# Patient Record
Sex: Female | Born: 1960 | Hispanic: No | Marital: Married | Smoking: Former smoker
Health system: Southern US, Community
[De-identification: ages and names within clinical notes are randomized; demographics above are authoritative.]

## PROBLEM LIST (undated history)

## (undated) DIAGNOSIS — C801 Malignant (primary) neoplasm, unspecified: Secondary | ICD-10-CM

## (undated) HISTORY — PX: ABDOMINAL HYSTERECTOMY: SHX81

---

## 1993-08-23 DIAGNOSIS — C801 Malignant (primary) neoplasm, unspecified: Secondary | ICD-10-CM

## 1993-08-23 HISTORY — DX: Malignant (primary) neoplasm, unspecified: C80.1

## 2016-03-17 ENCOUNTER — Ambulatory Visit: Payer: Self-pay | Attending: Internal Medicine

## 2016-10-04 ENCOUNTER — Other Ambulatory Visit: Payer: Self-pay | Admitting: Family Medicine

## 2016-10-04 DIAGNOSIS — Z1231 Encounter for screening mammogram for malignant neoplasm of breast: Secondary | ICD-10-CM

## 2016-10-05 ENCOUNTER — Encounter: Payer: Self-pay | Admitting: Radiology

## 2016-10-05 ENCOUNTER — Encounter (INDEPENDENT_AMBULATORY_CARE_PROVIDER_SITE_OTHER): Payer: Self-pay

## 2016-10-05 ENCOUNTER — Ambulatory Visit
Admission: RE | Admit: 2016-10-05 | Discharge: 2016-10-05 | Disposition: A | Discharge status: Home / Self Care | Payer: Managed Care, Other (non HMO) | Source: Ambulatory Visit | Attending: Family Medicine | Admitting: Family Medicine

## 2016-10-05 DIAGNOSIS — Z1231 Encounter for screening mammogram for malignant neoplasm of breast: Secondary | ICD-10-CM | POA: Insufficient documentation

## 2016-10-05 HISTORY — DX: Malignant (primary) neoplasm, unspecified: C80.1

## 2016-10-11 ENCOUNTER — Ambulatory Visit
Admission: RE | Admit: 2016-10-11 | Discharge: 2016-10-11 | Disposition: A | Discharge status: Home / Self Care | Payer: Managed Care, Other (non HMO) | Source: Ambulatory Visit | Attending: Family Medicine | Admitting: Family Medicine

## 2016-10-11 ENCOUNTER — Other Ambulatory Visit: Payer: Self-pay | Admitting: Family Medicine

## 2016-10-11 DIAGNOSIS — N6320 Unspecified lump in the left breast, unspecified quadrant: Secondary | ICD-10-CM

## 2016-10-11 DIAGNOSIS — R928 Other abnormal and inconclusive findings on diagnostic imaging of breast: Secondary | ICD-10-CM

## 2016-10-20 ENCOUNTER — Ambulatory Visit: Payer: Managed Care, Other (non HMO)

## 2016-10-20 ENCOUNTER — Other Ambulatory Visit: Payer: Managed Care, Other (non HMO)

## 2016-10-30 ENCOUNTER — Encounter: Payer: Self-pay | Admitting: *Deleted

## 2016-10-30 ENCOUNTER — Emergency Department: Payer: Managed Care, Other (non HMO)

## 2016-10-30 ENCOUNTER — Emergency Department
Admission: EM | Admit: 2016-10-30 | Discharge: 2016-10-30 | Disposition: A | Discharge status: Home / Self Care | Payer: Managed Care, Other (non HMO) | Attending: Emergency Medicine | Admitting: Emergency Medicine

## 2016-10-30 DIAGNOSIS — Z87891 Personal history of nicotine dependence: Secondary | ICD-10-CM | POA: Insufficient documentation

## 2016-10-30 DIAGNOSIS — R05 Cough: Secondary | ICD-10-CM | POA: Diagnosis present

## 2016-10-30 DIAGNOSIS — J209 Acute bronchitis, unspecified: Secondary | ICD-10-CM | POA: Diagnosis not present

## 2016-10-30 LAB — BASIC METABOLIC PANEL
ANION GAP: 8 (ref 5–15)
BUN: 10 mg/dL (ref 6–20)
CALCIUM: 9.4 mg/dL (ref 8.9–10.3)
CO2: 30 mmol/L (ref 22–32)
CREATININE: 0.71 mg/dL (ref 0.44–1.00)
Chloride: 102 mmol/L (ref 101–111)
Glucose, Bld: 120 mg/dL — ABNORMAL HIGH (ref 65–99)
Potassium: 4 mmol/L (ref 3.5–5.1)
Sodium: 140 mmol/L (ref 135–145)

## 2016-10-30 LAB — CBC
HCT: 43.3 % (ref 35.0–47.0)
HEMOGLOBIN: 15 g/dL (ref 12.0–16.0)
MCH: 30.1 pg (ref 26.0–34.0)
MCHC: 34.5 g/dL (ref 32.0–36.0)
MCV: 87.1 fL (ref 80.0–100.0)
PLATELETS: 311 10*3/uL (ref 150–440)
RBC: 4.98 MIL/uL (ref 3.80–5.20)
RDW: 12.8 % (ref 11.5–14.5)
WBC: 11.9 10*3/uL — ABNORMAL HIGH (ref 3.6–11.0)

## 2016-10-30 LAB — INFLUENZA PANEL BY PCR (TYPE A & B)
INFLAPCR: NEGATIVE
Influenza B By PCR: NEGATIVE

## 2016-10-30 LAB — TROPONIN I

## 2016-10-30 MED ORDER — PREDNISONE 20 MG PO TABS
60.0000 mg | ORAL_TABLET | Freq: Once | ORAL | Status: AC
Start: 2016-10-30 — End: 2016-10-30
  Administered 2016-10-30: 60 mg via ORAL
  Filled 2016-10-30: qty 3

## 2016-10-30 MED ORDER — PREDNISONE 20 MG PO TABS: 40.0000 mg | ORAL_TABLET | Freq: Every day | ORAL | 0 refills | Status: AC

## 2016-10-30 MED ORDER — ALBUTEROL SULFATE HFA 108 (90 BASE) MCG/ACT IN AERS: 2.0000 | INHALATION_SPRAY | Freq: Four times a day (QID) | RESPIRATORY_TRACT | 2 refills | Status: AC | PRN

## 2016-10-30 MED ORDER — IPRATROPIUM-ALBUTEROL 0.5-2.5 (3) MG/3ML IN SOLN
3.0000 mL | Freq: Once | RESPIRATORY_TRACT | Status: AC
  Administered 2016-10-30: 3 mL via RESPIRATORY_TRACT
  Filled 2016-10-30: qty 3

## 2016-10-30 MED ORDER — ACETAMINOPHEN 325 MG PO TABS
650.0000 mg | ORAL_TABLET | Freq: Once | ORAL | Status: AC | PRN
  Administered 2016-10-30: 650 mg via ORAL
  Filled 2016-10-30: qty 2

## 2016-10-30 MED ORDER — AZITHROMYCIN 250 MG PO TABS: ORAL_TABLET | ORAL | 0 refills | Status: AC

## 2016-10-30 NOTE — ED Triage Notes (Signed)
PT presents to ED [facility] due to low oxygen saturation of 91% on RA. No oxygen use at home. Pt denies having had fevers at home but reports a cough for the past two days with a sore throat and congestion. Pt reports "gold" colored sputum and also reports SOB.

## 2016-10-30 NOTE — Discharge Instructions (Signed)
Please return immediately if condition worsens. Please contact her primary physician or the physician you were given for referral. If you have any specialist physicians involved in her treatment and plan please also contact them. Thank you for using [facility]. ° °

## 2016-10-30 NOTE — ED Provider Notes (Signed)
Time Seen: Approximately 1830  I have reviewed the triage notes  Chief Complaint: Cough   History of Present Illness: Erica Hopkins is a 56 y.o. female who presents with a 2 day history of increasing dry nonproductive cough. States her symptoms started with some fever and body aches along with a sore throat and nasal congestion. She does have a productive nature to her cough. Patient has a long history of tobacco smoking but is not on any home oxygen therapy at this time. Patient denies any nausea, vomiting, loose stool or diarrhea, chest pain or abdominal pain. She denies any pulmonary emboli risk factors outside of smoking.. Remote history of Cervical cancer [facility] and was noted to have a pulse oximetry there of 92% on room air  Past Medical History:  Diagnosis Date  . Cancer ([place]) 1995   cervical ca    There are no active problems to display for this patient.   Past Surgical History:  Procedure Laterality Date  . ABDOMINAL HYSTERECTOMY      Past Surgical History:  Procedure Laterality Date  . ABDOMINAL HYSTERECTOMY        Allergies:  Patient has no allergy information on record.  Family History: Family History  Problem Relation Age of Onset  . Breast cancer Mother 37  . Breast cancer Maternal Aunt 72    Social History: Social History  Substance Use Topics  . Smoking status: Former Research scientist (life sciences)  . Smokeless tobacco: Never Used  . Alcohol use No     Review of Systems:   10 point review of systems was performed and was otherwise negative:  Constitutional: No fever Eyes: No visual disturbances ENT: No sore throat, ear pain Cardiac: No chest pain Respiratory: Shortness of breath especially with lying in a supine position. No audible wheezing or stridor at home Abdomen: No abdominal pain, no vomiting, No diarrhea Endocrine: No weight loss, No night sweats Extremities: No peripheral edema, cyanosis Skin: No  rashes, easy bruising Neurologic: No focal weakness, trouble with speech or swollowing Urologic: No dysuria, Hematuria, or urinary frequency   Physical Exam:  ED Triage Vitals  Enc Vitals Group     BP 10/30/16 1629 (!) 166/80     Pulse Rate 10/30/16 1629 96     Resp 10/30/16 1629 16     Temp 10/30/16 1629 (!) 100.9 F (38.3 C)     Temp Source 10/30/16 1629 Oral     SpO2 10/30/16 1629 94 %     Weight 10/30/16 1630 138 lb (62.6 kg)     Height 10/30/16 1630 5\' 5"  (1.651 m)     Head Circumference --      Peak Flow --      Pain Score --      Pain Loc --      Pain Edu? --      Excl. in [place]? --     General: Awake , Alert , and Oriented times 3; GCS 15 Head: Normal cephalic , atraumatic Eyes: Pupils equal , round, reactive to light Nose/Throat: No nasal drainage, patent upper airway without erythema or exudate.  Neck: Supple, Full range of motion, No anterior adenopathy or palpable thyroid masses Lungs: Mild end expiratory wheezing auscultated primarily at the apices Heart: Regular rate, regular rhythm without murmurs , gallops , or rubs Abdomen: Soft, non tender without rebound, guarding , or rigidity; bowel sounds positive and symmetric in all 4 quadrants. No organomegaly .  Extremities: 2 plus symmetric pulses. No edema, clubbing or cyanosis Neurologic: normal ambulation, Motor symmetric without deficits, sensory intact Skin: warm, dry, no rashes   Labs:   All laboratory work was reviewed including any pertinent negatives or positives listed below:  Labs Reviewed  BASIC METABOLIC PANEL - Abnormal; Notable for the following:       Result Value   Glucose, Bld 120 (*)    All other components within normal limits  CBC - Abnormal; Notable for the following:    WBC 11.9 (*)    All other components within normal limits  TROPONIN I  INFLUENZA PANEL BY PCR (TYPE A & B)    EKG: * ED ECG REPORT I, Daymon Larsen, the attending physician, personally viewed and interpreted  this ECG.  Date: 10/30/2016 EKG Time: *1639Rate: 93 Rhythm: normal sinus rhythm QRS Axis: normal Intervals: normal ST/T Wave abnormalities: normal Conduction Disturbances: none Narrative Interpretation: unremarkable No acute ischemic changes  Radiology: *  Chest x-ray shows no signs of infiltrate, pneumothorax. 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 She has a low-grade fever with a productive cough and be treated on an outpatient basis for bacterial bronchitis. Her pulse ox is somewhat labile but is generally around 94% she states she feels improved and will be discharged on an inhaler. I felt this was unlikely to be pulmonary embolism due to low risk factors, no calf tenderness or swelling, etc.     Assessment:  Acute bacterial bronchitis with bronchospasm Tobacco abuse     Plan: * Outpatient Patient was advised to return immediately if condition worsens. Patient was advised to follow up with their primary care physician or other specialized physicians involved in their outpatient care. The patient and/or family member/power of attorney had laboratory results reviewed at the bedside. All questions and concerns were addressed and appropriate discharge instructions were distributed by the nursing staff.             Daymon Larsen, MD 10/30/16 2029

## 2016-10-30 NOTE — ED Notes (Signed)
States sore throat, sinus congestions and itchy ears that began early this week, states cough that began Friday, states [facility] and was sent to Ed for eval, states her o2 levels were low at 92%, states hx of smoking, awake and alert

## 2017-09-21 ENCOUNTER — Other Ambulatory Visit: Payer: Self-pay | Admitting: Family Medicine

## 2017-09-21 DIAGNOSIS — Z1231 Encounter for screening mammogram for malignant neoplasm of breast: Secondary | ICD-10-CM

## 2017-10-11 ENCOUNTER — Ambulatory Visit
Admission: RE | Admit: 2017-10-11 | Discharge: 2017-10-11 | Disposition: A | Discharge status: Home / Self Care | Payer: Managed Care, Other (non HMO) | Source: Ambulatory Visit | Attending: Family Medicine | Admitting: Family Medicine

## 2017-10-11 DIAGNOSIS — Z1231 Encounter for screening mammogram for malignant neoplasm of breast: Secondary | ICD-10-CM | POA: Diagnosis present

## 2017-12-01 IMAGING — MG MM DIGITAL DIAGNOSTIC UNILAT*L* W/ TOMO W/ CAD
6 series · 6 of 14 positions shown · non-contrast
Comparison: Previous exam(s).

CLINICAL DATA: Callback from screening mammogram for possible mass
left breast

EXAM:
2D DIGITAL DIAGNOSTIC LEFT MAMMOGRAM WITH CAD AND ADJUNCT TOMO
ULTRASOUND LEFT BREAST

[L MLO synth-2D]
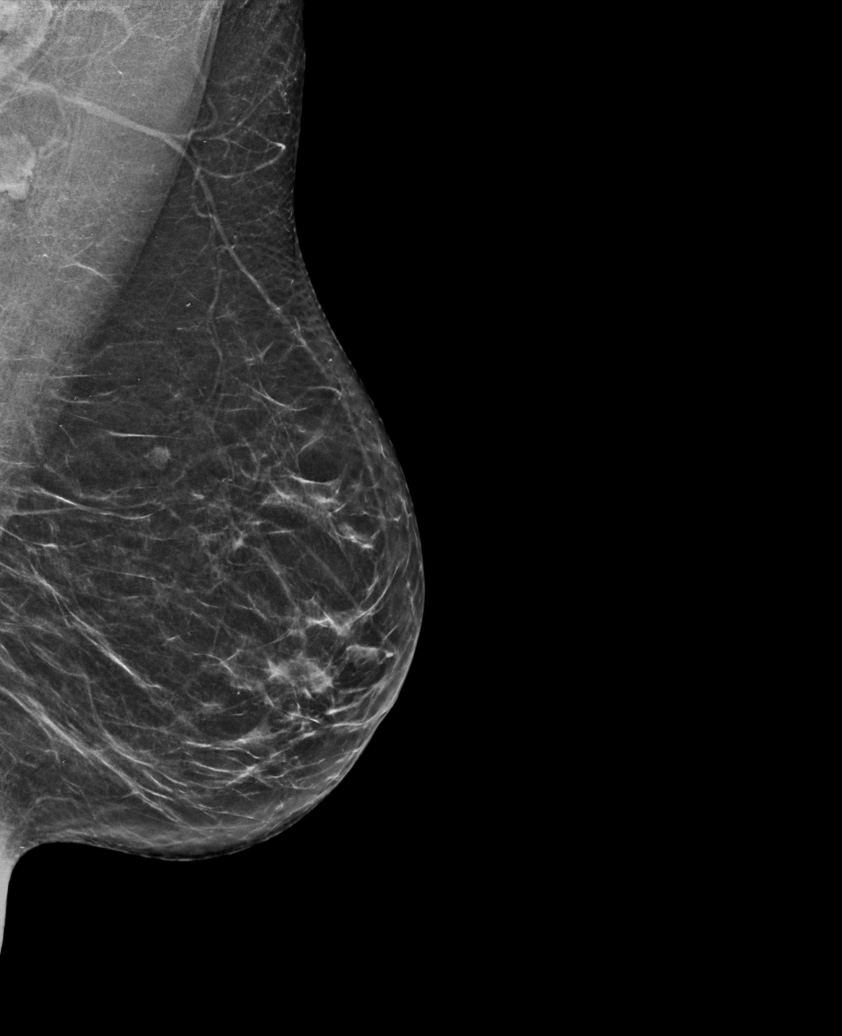

[L CC]
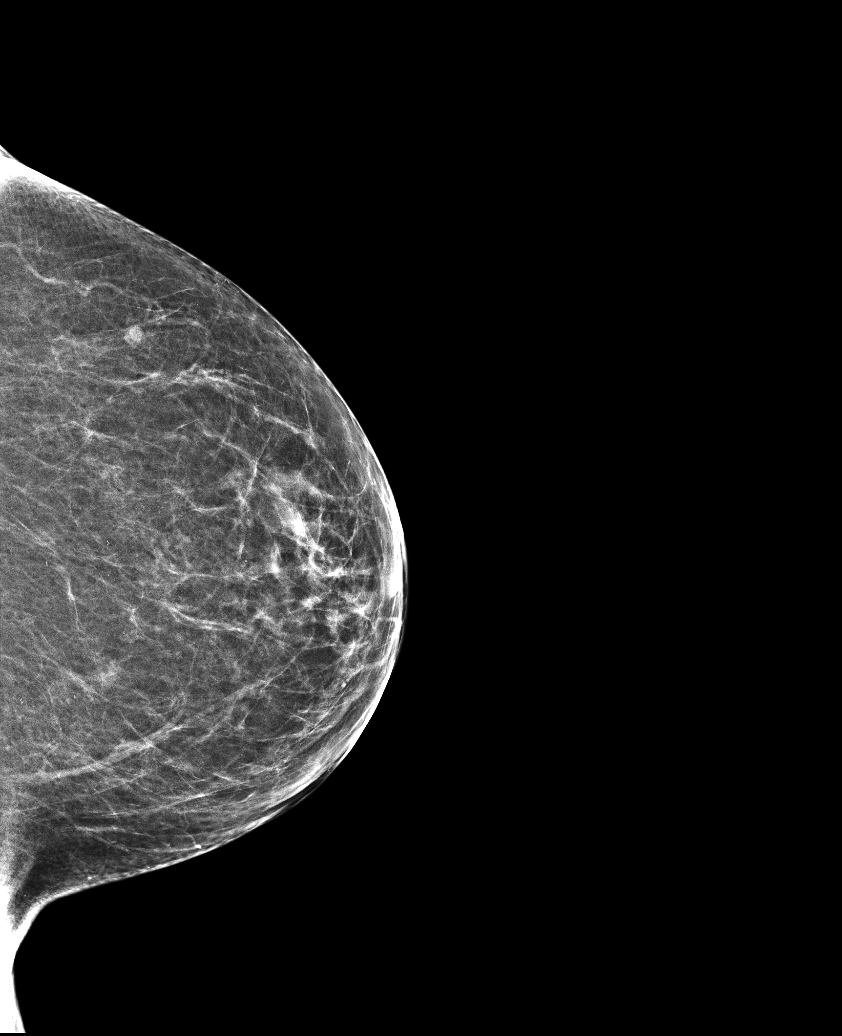

[L CC synth-2D]
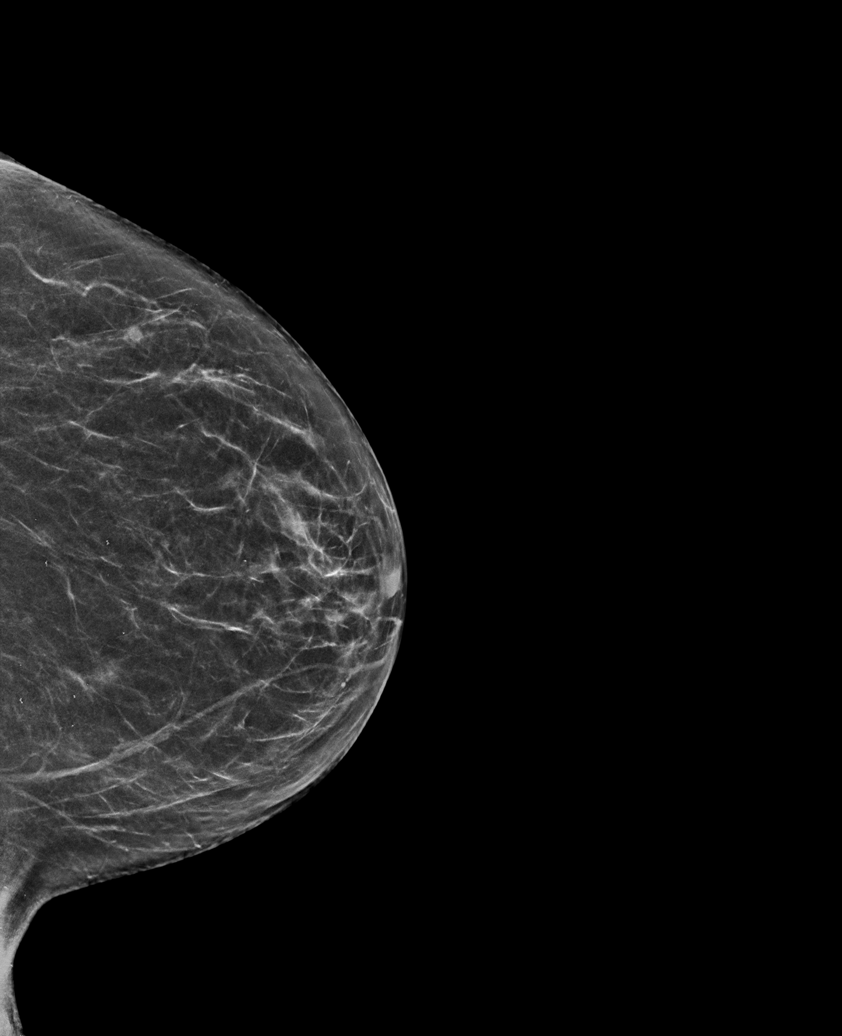

[L MLO]
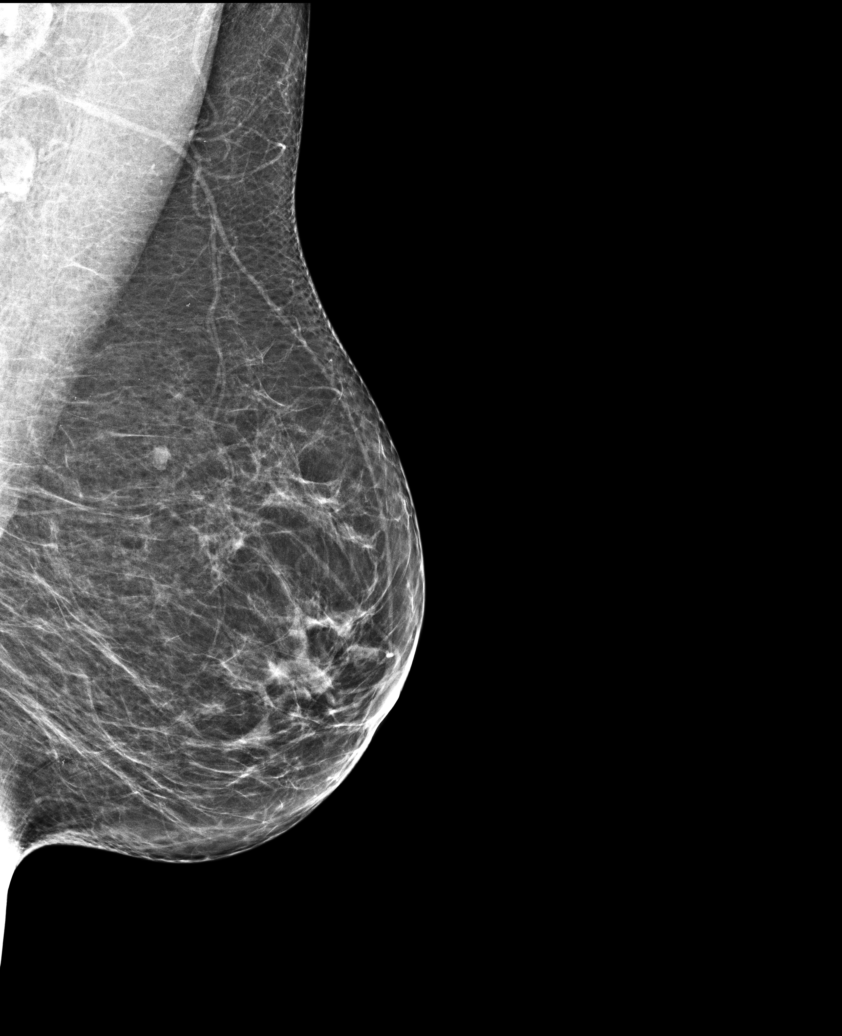

[L CC tomo · tomo slice 31/62.0]
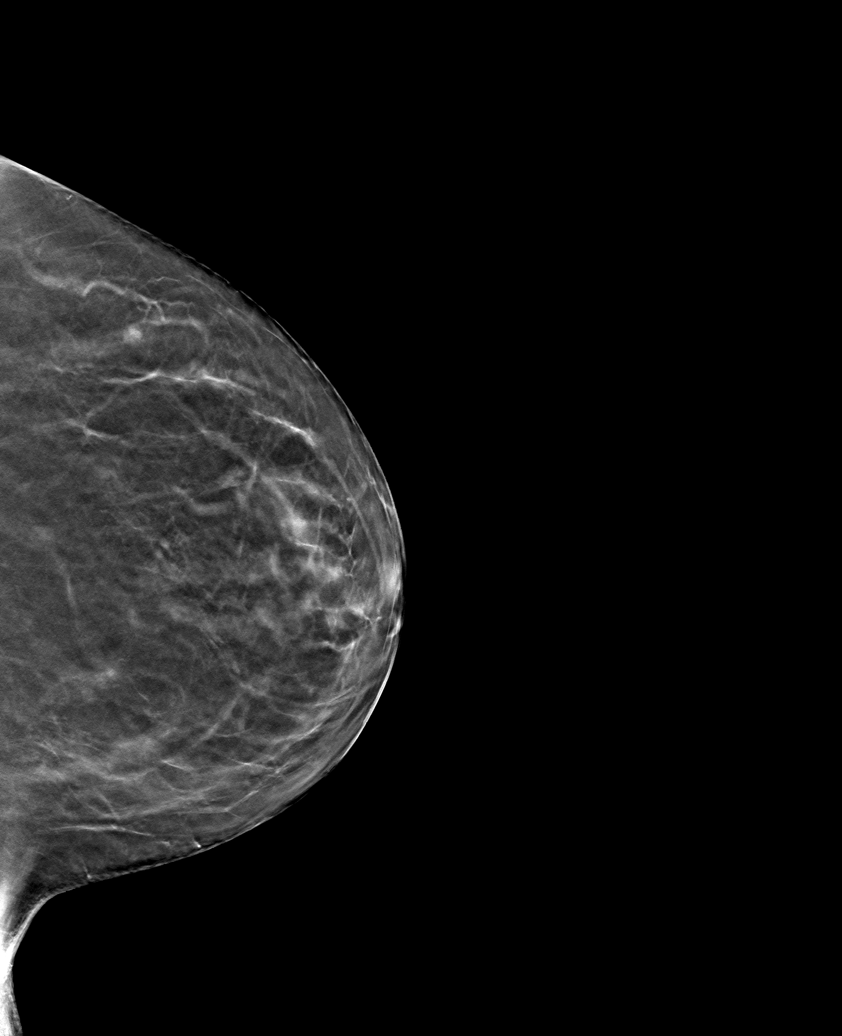

[L MLO tomo · tomo slice 32/63.0]
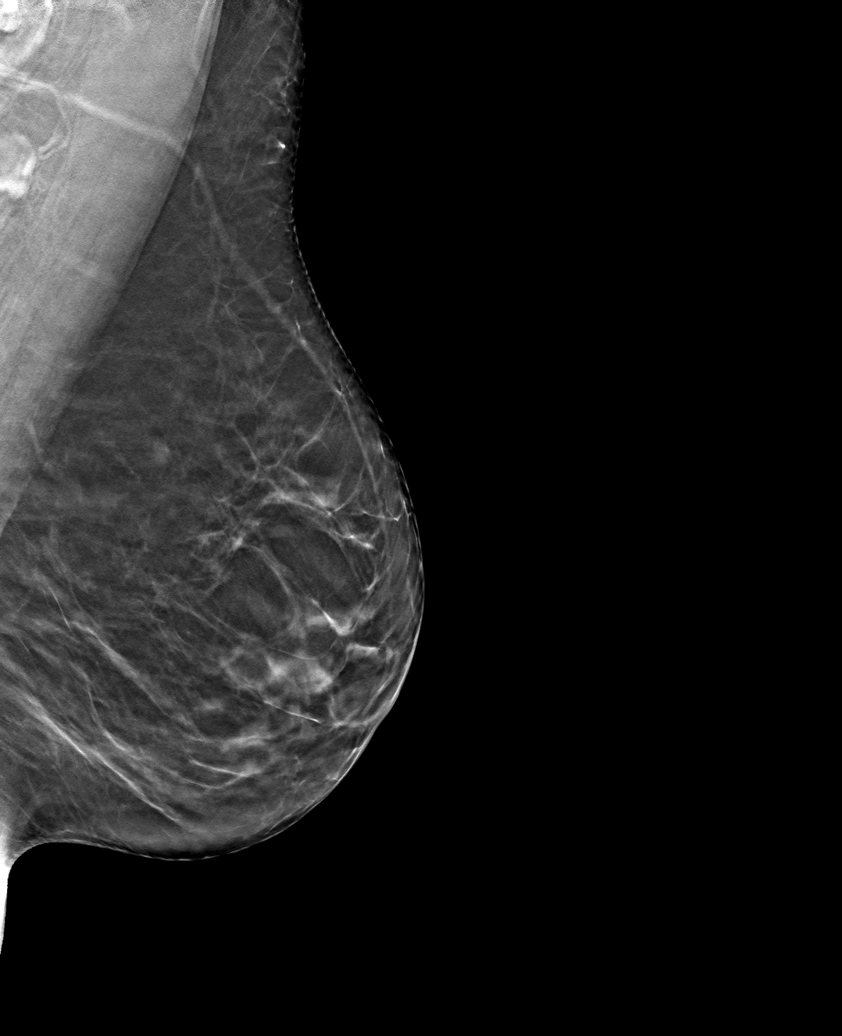

[6 of 14 positions shown; findings below may reference images not displayed]

ACR Breast Density Category b: There are scattered areas of
fibroglandular density.
FINDINGS: Cc and MLO views of the left breast are submitted. Previously
questioned mass in the upper-outer quadrant left breast persists on
additional views.

Mammographic images were processed with CAD.

Targeted ultrasound is performed, showing a 7 mm normal intramammary
lymph node at the left breast 1:30 o'clock 6 cm from nipple
correlating to the mammographic finding.
IMPRESSION: Benign findings.

RECOMMENDATION:
Routine screening mammogram back on schedule.

I have discussed the findings and recommendations with the patient.
Results were also provided in writing at the conclusion of the
visit. If applicable, a reminder letter will be sent to the patient
regarding the next appointment.

BI-RADS CATEGORY  2: Benign.

## 2018-09-27 ENCOUNTER — Other Ambulatory Visit: Payer: Self-pay | Admitting: Medical Oncology

## 2018-09-27 DIAGNOSIS — Z1231 Encounter for screening mammogram for malignant neoplasm of breast: Secondary | ICD-10-CM

## 2018-10-17 ENCOUNTER — Ambulatory Visit
Admission: RE | Admit: 2018-10-17 | Discharge: 2018-10-17 | Disposition: A | Discharge status: Home / Self Care | Payer: Managed Care, Other (non HMO) | Source: Ambulatory Visit | Attending: Medical Oncology | Admitting: Medical Oncology

## 2018-10-17 DIAGNOSIS — Z1231 Encounter for screening mammogram for malignant neoplasm of breast: Secondary | ICD-10-CM

## 2019-10-10 ENCOUNTER — Other Ambulatory Visit: Payer: Self-pay | Admitting: Medical Oncology

## 2019-10-10 DIAGNOSIS — Z1231 Encounter for screening mammogram for malignant neoplasm of breast: Secondary | ICD-10-CM

## 2019-11-19 ENCOUNTER — Ambulatory Visit
Admission: RE | Admit: 2019-11-19 | Discharge: 2019-11-19 | Disposition: A | Discharge status: Home / Self Care | Payer: Managed Care, Other (non HMO) | Source: Ambulatory Visit | Attending: Medical Oncology | Admitting: Medical Oncology

## 2019-11-19 DIAGNOSIS — Z1231 Encounter for screening mammogram for malignant neoplasm of breast: Secondary | ICD-10-CM | POA: Insufficient documentation

## 2020-11-25 ENCOUNTER — Other Ambulatory Visit: Payer: Self-pay | Admitting: Family Medicine

## 2020-11-25 DIAGNOSIS — Z1231 Encounter for screening mammogram for malignant neoplasm of breast: Secondary | ICD-10-CM

## 2020-12-01 ENCOUNTER — Ambulatory Visit
Admission: RE | Admit: 2020-12-01 | Discharge: 2020-12-01 | Disposition: A | Discharge status: Home / Self Care | Payer: 59 | Source: Ambulatory Visit | Attending: Family Medicine | Admitting: Family Medicine

## 2020-12-01 ENCOUNTER — Other Ambulatory Visit: Payer: Self-pay

## 2020-12-01 DIAGNOSIS — Z1231 Encounter for screening mammogram for malignant neoplasm of breast: Secondary | ICD-10-CM | POA: Insufficient documentation

## 2021-11-30 ENCOUNTER — Other Ambulatory Visit: Payer: Self-pay | Admitting: Family Medicine

## 2021-12-01 ENCOUNTER — Other Ambulatory Visit: Payer: Self-pay | Admitting: Family Medicine

## 2021-12-01 DIAGNOSIS — Z1231 Encounter for screening mammogram for malignant neoplasm of breast: Secondary | ICD-10-CM

## 2022-01-12 ENCOUNTER — Ambulatory Visit: Payer: 59

## 2022-02-02 ENCOUNTER — Ambulatory Visit
Admission: RE | Admit: 2022-02-02 | Discharge: 2022-02-02 | Disposition: A | Discharge status: Home / Self Care | Payer: 59 | Source: Ambulatory Visit | Attending: Family Medicine | Admitting: Family Medicine

## 2022-02-02 DIAGNOSIS — Z1231 Encounter for screening mammogram for malignant neoplasm of breast: Secondary | ICD-10-CM | POA: Insufficient documentation

## 2022-12-08 ENCOUNTER — Other Ambulatory Visit: Payer: Self-pay

## 2022-12-08 DIAGNOSIS — Z1231 Encounter for screening mammogram for malignant neoplasm of breast: Secondary | ICD-10-CM

## 2023-02-07 ENCOUNTER — Ambulatory Visit
Admission: RE | Admit: 2023-02-07 | Discharge: 2023-02-07 | Disposition: A | Discharge status: Home / Self Care | Payer: 59 | Source: Ambulatory Visit | Attending: Family Medicine | Admitting: Family Medicine

## 2023-02-07 DIAGNOSIS — Z1231 Encounter for screening mammogram for malignant neoplasm of breast: Secondary | ICD-10-CM | POA: Insufficient documentation

## 2024-01-23 ENCOUNTER — Other Ambulatory Visit: Payer: Self-pay | Admitting: Family Medicine

## 2024-01-23 DIAGNOSIS — Z1231 Encounter for screening mammogram for malignant neoplasm of breast: Secondary | ICD-10-CM

## 2024-02-21 ENCOUNTER — Ambulatory Visit
Admission: RE | Admit: 2024-02-21 | Discharge: 2024-02-21 | Disposition: A | Discharge status: Home / Self Care | Source: Ambulatory Visit | Attending: Family Medicine | Admitting: Family Medicine

## 2024-02-21 DIAGNOSIS — Z1231 Encounter for screening mammogram for malignant neoplasm of breast: Secondary | ICD-10-CM | POA: Insufficient documentation
